# Patient Record
Sex: Male | Born: 1945 | Race: White | Hispanic: No | Marital: Married | Smoking: Never smoker
Health system: Southern US, Community
[De-identification: ages and names within clinical notes are randomized; demographics above are authoritative.]

## PROBLEM LIST (undated history)

## (undated) DIAGNOSIS — I10 Essential (primary) hypertension: Secondary | ICD-10-CM

## (undated) DIAGNOSIS — E785 Hyperlipidemia, unspecified: Secondary | ICD-10-CM

## (undated) HISTORY — PX: MITRAL VALVE REPAIR: SHX2039

## (undated) HISTORY — PX: HERNIA REPAIR: SHX51

---

## 2019-11-25 ENCOUNTER — Encounter (HOSPITAL_COMMUNITY): Payer: Self-pay

## 2019-11-25 ENCOUNTER — Other Ambulatory Visit: Payer: Self-pay

## 2019-11-25 ENCOUNTER — Ambulatory Visit (HOSPITAL_COMMUNITY)
Admission: EM | Admit: 2019-11-25 | Discharge: 2019-11-25 | Disposition: A | Discharge status: Home / Self Care | Payer: Medicare Other | Attending: Family Medicine | Admitting: Family Medicine

## 2019-11-25 DIAGNOSIS — J209 Acute bronchitis, unspecified: Secondary | ICD-10-CM

## 2019-11-25 DIAGNOSIS — U071 COVID-19: Secondary | ICD-10-CM

## 2019-11-25 HISTORY — DX: Essential (primary) hypertension: I10

## 2019-11-25 HISTORY — DX: Hyperlipidemia, unspecified: E78.5

## 2019-11-25 LAB — POC SARS CORONAVIRUS 2 AG -  ED: SARS Coronavirus 2 Ag: POSITIVE — AB

## 2019-11-25 LAB — POC SARS CORONAVIRUS 2 AG: SARS Coronavirus 2 Ag: POSITIVE — AB

## 2019-11-25 MED ORDER — DEXAMETHASONE SODIUM PHOSPHATE 10 MG/ML IJ SOLN
10.0000 mg | Freq: Once | INTRAMUSCULAR | Status: AC
  Administered 2019-11-25: 10 mg via INTRAMUSCULAR

## 2019-11-25 MED ORDER — ALBUTEROL SULFATE HFA 108 (90 BASE) MCG/ACT IN AERS: 1.0000 | INHALATION_SPRAY | RESPIRATORY_TRACT | 0 refills | Status: AC | PRN

## 2019-11-25 MED ORDER — BENZONATATE 200 MG PO CAPS: 200.0000 mg | ORAL_CAPSULE | Freq: Three times a day (TID) | ORAL | 0 refills | Status: AC | PRN

## 2019-11-25 MED ORDER — PREDNISONE 10 MG PO TABS: ORAL_TABLET | ORAL | 0 refills | Status: AC

## 2019-11-25 MED ORDER — DEXAMETHASONE SODIUM PHOSPHATE 10 MG/ML IJ SOLN
INTRAMUSCULAR | Status: AC
  Filled 2019-11-25: qty 1

## 2019-11-25 MED ORDER — AZITHROMYCIN 250 MG PO TABS: 250.0000 mg | ORAL_TABLET | Freq: Every day | ORAL | 0 refills | Status: AC

## 2019-11-25 NOTE — Discharge Instructions (Addendum)
COVID test positive, please quarantine for 10 days from when your symptoms started as well as until symptoms improving and fever free for 24 hours Please begin azithromycin-2 tablets today, 1 tablet for the following 4 days We gave you a shot of Decadron today, may continue with prednisone taper beginning in the morning with 6 tablets, decrease by 1 tablet each day until completion, take with food if you are able Albuterol inhaler as needed for shortness of breath and wheezing, chest tightness Tessalon every 8 hours for cough  Please follow-up if symptoms not resolving or worsening, developing fevers, increased shortness of breath or difficulty breathing, chest pain     Person Under Monitoring Name: Oscar Peters  Location: [place] [address]   Infection Prevention Recommendations for Individuals Confirmed to have, or Being Evaluated for, 2019 Novel Coronavirus (COVID-19) Infection Who Receive Care at Home  Individuals who are confirmed to have, or are being evaluated for, COVID-19 should follow the prevention steps below until a healthcare provider or local or state health department says they can return to normal activities.  Stay home except to get medical care You should restrict activities outside your home, except for getting medical care. Do not go to work, school, or public areas, and do not use public transportation or taxis.  Call ahead before visiting your doctor Before your medical appointment, call the healthcare provider and tell them that you have, or are being evaluated for, COVID-19 infection. This will help the healthcare providers office take steps to keep other people from getting infected. Ask your healthcare provider to call the local or state health department.  Monitor your symptoms Seek prompt medical attention if your illness is worsening (e.g., difficulty breathing). Before going to your medical appointment, call the healthcare  provider and tell them that you have, or are being evaluated for, COVID-19 infection. Ask your healthcare provider to call the local or state health department.  Wear a facemask You should wear a facemask that covers your nose and mouth when you are in the same room with other people and when you visit a healthcare provider. People who live with or visit you should also wear a facemask while they are in the same room with you.  Separate yourself from other people in your home As much as possible, you should stay in a different room from other people in your home. Also, you should use a separate bathroom, if available.  Avoid sharing household items You should not share dishes, drinking glasses, cups, eating utensils, towels, bedding, or other items with other people in your home. After using these items, you should wash them thoroughly with soap and water.  Cover your coughs and sneezes Cover your mouth and nose with a tissue when you cough or sneeze, or you can cough or sneeze into your sleeve. Throw used tissues in a lined trash can, and immediately wash your hands with soap and water for at least 20 seconds or use an alcohol-based hand rub.  Wash your [facility] your hands often and thoroughly with soap and water for at least 20 seconds. You can use an alcohol-based hand sanitizer if soap and water are not available and if your hands are not visibly dirty. Avoid touching your eyes, nose, and mouth with unwashed hands.   Prevention Steps for Caregivers and Household Members of Individuals Confirmed to have, or Being Evaluated for, COVID-19 Infection Being Cared for in the Home  If you live with,  or provide care at home for, a person confirmed to have, or being evaluated for, COVID-19 infection please follow these guidelines to prevent infection:  Follow healthcare providers instructions Make sure that you understand and can help the patient follow any healthcare provider  instructions for all care.  Provide for the patients basic needs You should help the patient with basic needs in the home and provide support for getting groceries, prescriptions, and other personal needs.  Monitor the patients symptoms If they are getting sicker, call his or her medical provider and tell them that the patient has, or is being evaluated for, COVID-19 infection. This will help the healthcare providers office take steps to keep other people from getting infected. Ask the healthcare provider to call the local or state health department.  Limit the number of people who have contact with the patient If possible, have only one caregiver for the patient. Other household members should stay in another home or place of residence. If this is not possible, they should stay in another room, or be separated from the patient as much as possible. Use a separate bathroom, if available. Restrict visitors who do not have an essential need to be in the home.  Keep older adults, very young children, and other sick people away from the patient Keep older adults, very young children, and those who have compromised immune systems or chronic health conditions away from the patient. This includes people with chronic heart, lung, or kidney conditions, diabetes, and cancer.  Ensure good ventilation Make sure that shared spaces in the home have good air flow, such as from an air conditioner or an opened window, weather permitting.  Wash your hands often Wash your hands often and thoroughly with soap and water for at least 20 seconds. You can use an alcohol based hand sanitizer if soap and water are not available and if your hands are not visibly dirty. Avoid touching your eyes, nose, and mouth with unwashed hands. Use disposable paper towels to dry your hands. If not available, use dedicated cloth towels and replace them when they become wet.  Wear a facemask and gloves Wear a disposable  facemask at all times in the room and gloves when you touch or have contact with the patients blood, body fluids, and/or secretions or excretions, such as sweat, saliva, sputum, nasal mucus, vomit, urine, or feces.  Ensure the mask fits over your nose and mouth tightly, and do not touch it during use. Throw out disposable facemasks and gloves after using them. Do not reuse. Wash your hands immediately after removing your facemask and gloves. If your personal clothing becomes contaminated, carefully remove clothing and launder. Wash your hands after handling contaminated clothing. Place all used disposable facemasks, gloves, and other waste in a lined container before disposing them with other household waste. Remove gloves and wash your hands immediately after handling these items.  Do not share dishes, glasses, or other household items with the patient Avoid sharing household items. You should not share dishes, drinking glasses, cups, eating utensils, towels, bedding, or other items with a patient who is confirmed to have, or being evaluated for, COVID-19 infection. After the person uses these items, you should wash them thoroughly with soap and water.  Wash laundry thoroughly Immediately remove and wash clothes or bedding that have blood, body fluids, and/or secretions or excretions, such as sweat, saliva, sputum, nasal mucus, vomit, urine, or feces, on them. Wear gloves when handling laundry from the patient. Read and  follow directions on labels of laundry or clothing items and detergent. In general, wash and dry with the warmest temperatures recommended on the label.  Clean all areas the individual has used often Clean all touchable surfaces, such as counters, tabletops, doorknobs, bathroom fixtures, toilets, phones, keyboards, tablets, and bedside tables, every day. Also, clean any surfaces that may have blood, body fluids, and/or secretions or excretions on them. Wear gloves when cleaning  surfaces the patient has come in contact with. Use a diluted bleach solution (e.g., dilute bleach with 1 part bleach and 10 parts water) or a household disinfectant with a label that says EPA-registered for coronaviruses. To make a bleach solution at home, add 1 tablespoon of bleach to 1 quart (4 cups) of water. For a larger supply, add  cup of bleach to 1 gallon (16 cups) of water. Read labels of cleaning products and follow recommendations provided on product labels. Labels contain instructions for safe and effective use of the cleaning product including precautions you should take when applying the product, such as wearing gloves or eye protection and making sure you have good ventilation during use of the product. Remove gloves and wash hands immediately after cleaning.  Monitor yourself for signs and symptoms of illness Caregivers and household members are considered close contacts, should monitor their health, and will be asked to limit movement outside of the home to the extent possible. Follow the monitoring steps for close contacts listed on the symptom monitoring form.   ? If you have additional questions, contact your local health department or call the epidemiologist on call at [phone] (available 24/7). ? This guidance is subject to change. For the most up-to-date guidance from [facility], please refer to their website: [email or web address]

## 2019-11-25 NOTE — ED Triage Notes (Signed)
Pt presents to UC w/ c/o dry cough x6 days. Pt has hx of bronchitis. Pt c/o wheezing at times. Pt denies fevers.

## 2019-11-25 NOTE — ED Provider Notes (Addendum)
[facility]    CSN: [id number] Arrival date & time: 11/25/19  1231      History   Chief Complaint Chief Complaint  Patient presents with   Cough    HPI Oscar Peters is a 73 y.o. male history of hypertension, hyperlipidemia, presenting today for evaluation of a cough.  Patient states that he has had a cough for approximately 1 week.  Symptoms began on Monday.  He has had a lot of wheezing and difficulty taking a deep breath without coughing.  He has felt chest tightness.  Feels very similar to when he had bronchitis last year.  Denies history of asthma.  He denies any known fevers.  Denies known exposure to Covid.  He has been using over-the-counter Robitussin as well as NyQuil.  HPI  Past Medical History:  Diagnosis Date   Hyperlipidemia    Hypertension     There are no active problems to display for this patient.   Past Surgical History:  Procedure Laterality Date   HERNIA REPAIR     MITRAL VALVE REPAIR         Home Medications    Prior to Admission medications   Medication Sig Start Date End Date Taking? Authorizing Provider  aspirin EC 81 MG tablet Take 81 mg by mouth daily.   Yes [provider]  atorvastatin (LIPITOR) 10 MG tablet Take 10 mg by mouth daily.   Yes [provider]  co-enzyme Q-10 50 MG capsule Take 50 mg by mouth daily.   Yes [provider]  losartan (COZAAR) 25 MG tablet Take 25 mg by mouth daily.   Yes [provider]  metoprolol tartrate (LOPRESSOR) 50 MG tablet Take 50 mg by mouth 2 (two) times daily.   Yes [provider]  albuterol (VENTOLIN HFA) 108 (90 Base) MCG/ACT inhaler Inhale 1-2 puffs into the lungs every 4 (four) hours as needed for wheezing or shortness of breath. 11/25/19   Dewarren Ledbetter C, PA-C  azithromycin (ZITHROMAX) 250 MG tablet Take 1 tablet (250 mg total) by mouth daily. Take first 2 tablets together, then 1 every day until finished. 11/25/19   Christyan Reger,  Wyeth Hoffer C, PA-C  benzonatate (TESSALON) 200 MG capsule Take 1 capsule (200 mg total) by mouth 3 (three) times daily as needed for up to 7 days for cough. 11/25/19 12/02/19  Ashden Sonnenberg C, PA-C  predniSONE (DELTASONE) 10 MG tablet Begin with 6 tab on day 1, 5 tabs on day 2, 4 tab on day 3, 3 tab on day 4, 2 tab on day 5, 1 tab on day 6, take with food and in the morning if you are able 11/25/19   Luvina Poirier, Junius Creamer, PA-C    Family History Family History  Problem Relation Age of Onset   Healthy Mother    Healthy Father     Social History Social History   Tobacco Use   Smoking status: Never Smoker   Smokeless tobacco: Never Used  Substance Use Topics   Alcohol use: Not on file   Drug use: Not on file     Allergies   Patient has no known allergies.   Review of Systems Review of Systems  Constitutional: Negative for activity change, appetite change, chills, fatigue and fever.  HENT: Negative for congestion, ear pain, rhinorrhea, sinus pressure, sore throat and trouble swallowing.   Eyes: Negative for discharge and redness.  Respiratory: Positive for cough, chest tightness, shortness of breath and wheezing.   Cardiovascular: Negative  for chest pain.  Gastrointestinal: Negative for abdominal pain, diarrhea, nausea and vomiting.  Musculoskeletal: Negative for myalgias.  Skin: Negative for rash.  Neurological: Negative for dizziness, light-headedness and headaches.     Physical Exam Triage Vital Signs ED Triage Vitals  Enc Vitals Group     BP 11/25/19 1320 (!) 143/87     Pulse Rate 11/25/19 1320 69     Resp 11/25/19 1320 16     Temp 11/25/19 1320 99.2 F (37.3 C)     Temp Source 11/25/19 1320 Oral     SpO2 11/25/19 1320 95 %     Weight --      Height --      Head Circumference --      Peak Flow --      Pain Score 11/25/19 1323 0     Pain Loc --      Pain Edu? --      Excl. in GC? --    No data found.  Updated Vital Signs BP (!) 143/87 (BP Location: Left  Arm)    Pulse 69    Temp 99.2 F (37.3 C) (Oral)    Resp 16    SpO2 95%   Visual Acuity Right Eye Distance:   Left Eye Distance:   Bilateral Distance:    Right Eye Near:   Left Eye Near:    Bilateral Near:     Physical Exam Vitals signs and nursing note reviewed.  Constitutional:      Appearance: He is well-developed.  HENT:     Head: Normocephalic and atraumatic.     Ears:     Comments: Bilateral ears without tenderness to palpation of external auricle, tragus and mastoid, EAC's without erythema or swelling, TM's with good bony landmarks and cone of light. Non erythematous.     Nose:     Comments: Nasal mucosa pink, nonswollen turbinates    Mouth/Throat:     Comments: Oral mucosa pink and moist, no tonsillar enlargement or exudate. Posterior pharynx patent and nonerythematous, no uvula deviation or swelling. Normal phonation.  Eyes:     Conjunctiva/sclera: Conjunctivae normal.  Neck:     Musculoskeletal: Neck supple.  Cardiovascular:     Rate and Rhythm: Normal rate and regular rhythm.     Heart sounds: No murmur.  Pulmonary:     Effort: Pulmonary effort is normal. No respiratory distress.     Breath sounds: Wheezing and rhonchi present.     Comments: Frequent coughing in room, inspiratory and expiratory wheezing and rhonchi noted throughout all lung fields, all lung sounds were coarse Abdominal:     Palpations: Abdomen is soft.     Tenderness: There is no abdominal tenderness.  Skin:    General: Skin is warm and dry.  Neurological:     Mental Status: He is alert.      UC Treatments / Results  Labs (all labs ordered are listed, but only abnormal results are displayed) Labs Reviewed  POC SARS CORONAVIRUS 2 AG -  ED - Abnormal; Notable for the following components:      Result Value   SARS Coronavirus 2 Ag POSITIVE (*)    All other components within normal limits  POC SARS CORONAVIRUS 2 AG - Abnormal; Notable for the following components:   SARS Coronavirus 2  Ag POSITIVE (*)    All other components within normal limits    EKG   Radiology No results found.  Procedures Procedures (including critical care time)  Medications  Ordered in UC Medications  dexamethasone (DECADRON) injection 10 mg (has no administration in time range)    Initial Impression / Assessment and Plan / UC Course  I have reviewed the triage vital signs and the nursing notes.  Pertinent labs & imaging results that were available during my care of the patient were reviewed by me and considered in my medical decision making (see chart for details).     Rapid Covid positive, lung exam also suggestive of bronchitis.  Initiating on prednisone and albuterol inhaler, given symptoms x1 week we will go ahead and cover atypicals with azithromycin.  Tessalon for cough.  Decadron prior to discharge given significant wheezing and inflammation in lungs.  Continue to rest and drink plenty of fluids.  Discussed quarantine requirements.  Continue to monitor breathing,Discussed strict return precautions. Patient verbalized understanding and is agreeable with plan.  Final Clinical Impressions(s) / UC Diagnoses   Final diagnoses:  Acute bronchitis, unspecified organism  COVID-19 virus infection     Discharge Instructions     COVID test positive, please quarantine for 10 days from when your symptoms started as well as until symptoms improving and fever free for 24 hours Please begin azithromycin-2 tablets today, 1 tablet for the following 4 days We gave you a shot of Decadron today, may continue with prednisone taper beginning in the morning with 6 tablets, decrease by 1 tablet each day until completion, take with food if you are able Albuterol inhaler as needed for shortness of breath and wheezing, chest tightness Tessalon every 8 hours for cough  Please follow-up if symptoms not resolving or worsening, developing fevers, increased shortness of breath or difficulty breathing, chest  pain     Person Under Monitoring Name: Oscar Peters  Location: [place] [address]   Infection Prevention Recommendations for Individuals Confirmed to have, or Being Evaluated for, 2019 Novel Coronavirus (COVID-19) Infection Who Receive Care at Home  Individuals who are confirmed to have, or are being evaluated for, COVID-19 should follow the prevention steps below until a healthcare provider or local or state health department says they can return to normal activities.  Stay home except to get medical care You should restrict activities outside your home, except for getting medical care. Do not go to work, school, or public areas, and do not use public transportation or taxis.  Call ahead before visiting your doctor Before your medical appointment, call the healthcare provider and tell them that you have, or are being evaluated for, COVID-19 infection. This will help the healthcare providers office take steps to keep other people from getting infected. Ask your healthcare provider to call the local or state health department.  Monitor your symptoms Seek prompt medical attention if your illness is worsening (e.g., difficulty breathing). Before going to your medical appointment, call the healthcare provider and tell them that you have, or are being evaluated for, COVID-19 infection. Ask your healthcare provider to call the local or state health department.  Wear a facemask You should wear a facemask that covers your nose and mouth when you are in the same room with other people and when you visit a healthcare provider. People who live with or visit you should also wear a facemask while they are in the same room with you.  Separate yourself from other people in your home As much as possible, you should stay in a different room from other people in your home. Also, you should use a separate bathroom, if  available.  Avoid sharing household items You should  not share dishes, drinking glasses, cups, eating utensils, towels, bedding, or other items with other people in your home. After using these items, you should wash them thoroughly with soap and water.  Cover your coughs and sneezes Cover your mouth and nose with a tissue when you cough or sneeze, or you can cough or sneeze into your sleeve. Throw used tissues in a lined trash can, and immediately wash your hands with soap and water for at least 20 seconds or use an alcohol-based hand rub.  Wash your [employer] your hands often and thoroughly with soap and water for at least 20 seconds. You can use an alcohol-based hand sanitizer if soap and water are not available and if your hands are not visibly dirty. Avoid touching your eyes, nose, and mouth with unwashed hands.   Prevention Steps for Caregivers and Household Members of Individuals Confirmed to have, or Being Evaluated for, COVID-19 Infection Being Cared for in the Home  If you live with, or provide care at home for, a person confirmed to have, or being evaluated for, COVID-19 infection please follow these guidelines to prevent infection:  Follow healthcare providers instructions Make sure that you understand and can help the patient follow any healthcare provider instructions for all care.  Provide for the patients basic needs You should help the patient with basic needs in the home and provide support for getting groceries, prescriptions, and other personal needs.  Monitor the patients symptoms If they are getting sicker, call his or her medical provider and tell them that the patient has, or is being evaluated for, COVID-19 infection. This will help the healthcare providers office take steps to keep other people from getting infected. Ask the healthcare provider to call the local or state health department.  Limit the number of people who have contact with the patient  If possible, have only one caregiver for the  patient.  Other household members should stay in another home or place of residence. If this is not possible, they should stay  in another room, or be separated from the patient as much as possible. Use a separate bathroom, if available.  Restrict visitors who do not have an essential need to be in the home.  Keep older adults, very young children, and other sick people away from the patient Keep older adults, very young children, and those who have compromised immune systems or chronic health conditions away from the patient. This includes people with chronic heart, lung, or kidney conditions, diabetes, and cancer.  Ensure good ventilation Make sure that shared spaces in the home have good air flow, such as from an air conditioner or an opened window, weather permitting.  Wash your hands often  Wash your hands often and thoroughly with soap and water for at least 20 seconds. You can use an alcohol based hand sanitizer if soap and water are not available and if your hands are not visibly dirty.  Avoid touching your eyes, nose, and mouth with unwashed hands.  Use disposable paper towels to dry your hands. If not available, use dedicated cloth towels and replace them when they become wet.  Wear a facemask and gloves  Wear a disposable facemask at all times in the room and gloves when you touch or have contact with the patients blood, body fluids, and/or secretions or excretions, such as sweat, saliva, sputum, nasal mucus, vomit, urine, or feces.  Ensure the mask fits over your  nose and mouth tightly, and do not touch it during use.  Throw out disposable facemasks and gloves after using them. Do not reuse.  Wash your hands immediately after removing your facemask and gloves.  If your personal clothing becomes contaminated, carefully remove clothing and launder. Wash your hands after handling contaminated clothing.  Place all used disposable facemasks, gloves, and other waste in a lined  container before disposing them with other household waste.  Remove gloves and wash your hands immediately after handling these items.  Do not share dishes, glasses, or other household items with the patient  Avoid sharing household items. You should not share dishes, drinking glasses, cups, eating utensils, towels, bedding, or other items with a patient who is confirmed to have, or being evaluated for, COVID-19 infection.  After the person uses these items, you should wash them thoroughly with soap and water.  Wash laundry thoroughly  Immediately remove and wash clothes or bedding that have blood, body fluids, and/or secretions or excretions, such as sweat, saliva, sputum, nasal mucus, vomit, urine, or feces, on them.  Wear gloves when handling laundry from the patient.  Read and follow directions on labels of laundry or clothing items and detergent. In general, wash and dry with the warmest temperatures recommended on the label.  Clean all areas the individual has used often  Clean all touchable surfaces, such as counters, tabletops, doorknobs, bathroom fixtures, toilets, phones, keyboards, tablets, and bedside tables, every day. Also, clean any surfaces that may have blood, body fluids, and/or secretions or excretions on them.  Wear gloves when cleaning surfaces the patient has come in contact with.  Use a diluted bleach solution (e.g., dilute bleach with 1 part bleach and 10 parts water) or a household disinfectant with a label that says EPA-registered for coronaviruses. To make a bleach solution at home, add 1 tablespoon of bleach to 1 quart (4 cups) of water. For a larger supply, add  cup of bleach to 1 gallon (16 cups) of water.  Read labels of cleaning products and follow recommendations provided on product labels. Labels contain instructions for safe and effective use of the cleaning product including precautions you should take when applying the product, such as wearing gloves or  eye protection and making sure you have good ventilation during use of the product.  Remove gloves and wash hands immediately after cleaning.  Monitor yourself for signs and symptoms of illness Caregivers and household members are considered close contacts, should monitor their health, and will be asked to limit movement outside of the home to the extent possible. Follow the monitoring steps for close contacts listed on the symptom monitoring form.   ? If you have additional questions, contact your local health department or call the epidemiologist on call at [phone] (available 24/7). ? This guidance is subject to change. For the most up-to-date guidance from [facility], please refer to their website: [email or web address].hu    ED Prescriptions    Medication Sig Dispense Auth. Provider   albuterol (VENTOLIN HFA) 108 (90 Base) MCG/ACT inhaler Inhale 1-2 puffs into the lungs every 4 (four) hours as needed for wheezing or shortness of breath. 18 g Jeff Frieden C, PA-C   benzonatate (TESSALON) 200 MG capsule Take 1 capsule (200 mg total) by mouth 3 (three) times daily as needed for up to 7 days for cough. 28 capsule Burnice Vassel C, PA-C   azithromycin (ZITHROMAX) 250 MG tablet Take 1 tablet (250 mg total) by mouth daily. Take first 2 tablets  together, then 1 every day until finished. 6 tablet Nazia Rhines C, PA-C   predniSONE (DELTASONE) 10 MG tablet Begin with 6 tab on day 1, 5 tabs on day 2, 4 tab on day 3, 3 tab on day 4, 2 tab on day 5, 1 tab on day 6, take with food and in the morning if you are able 21 tablet Emylee Decelle C, PA-C     PDMP not reviewed this encounter.   Lew DawesWieters, Jomarie Gellis C, PA-C 11/25/19 1403    Lew DawesWieters, Tinea Nobile C, PA-C 11/25/19 1408

## 2019-11-30 ENCOUNTER — Ambulatory Visit (HOSPITAL_COMMUNITY)
Admission: EM | Admit: 2019-11-30 | Discharge: 2019-11-30 | Disposition: A | Discharge status: Home / Self Care | Payer: Medicare Other | Attending: Family Medicine | Admitting: Family Medicine

## 2019-11-30 ENCOUNTER — Encounter (HOSPITAL_COMMUNITY): Payer: Self-pay

## 2019-11-30 ENCOUNTER — Telehealth: Payer: Medicare Other

## 2019-11-30 ENCOUNTER — Ambulatory Visit (INDEPENDENT_AMBULATORY_CARE_PROVIDER_SITE_OTHER): Payer: Medicare Other

## 2019-11-30 ENCOUNTER — Other Ambulatory Visit: Payer: Self-pay

## 2019-11-30 DIAGNOSIS — R066 Hiccough: Secondary | ICD-10-CM | POA: Diagnosis not present

## 2019-11-30 DIAGNOSIS — U071 COVID-19: Secondary | ICD-10-CM | POA: Diagnosis not present

## 2019-11-30 MED ORDER — FAMOTIDINE 20 MG PO TABS: 20.0000 mg | ORAL_TABLET | Freq: Two times a day (BID) | ORAL | 0 refills | Status: AC

## 2019-11-30 NOTE — ED Provider Notes (Signed)
[facility]    CSN: [id number] Arrival date & time: 11/30/19  1056      History   Chief Complaint Chief Complaint  Patient presents with  . COVID+, hiccups, fever    HPI Oscar Peters is a 73 y.o. male.   Manson Allan presents with complaints of hiccups. Stops for a few hours and then returns and will last for hours. Has had hiccups like this for a few days now. Was seen here 11/29 and tested positive for covid-19.  States his breathing feels better. Completing course of prednisone today. Already completed course of azithromycin, and has used tessalon as needed. No increased shortness of breath. Feels weak. Temps of 99's at home. Temp 22f 102 a few days ago. Feels like temperatures are improving overall. Still using prn tylenol and motrin, last last night. No chest pain. Denies specific increased work of breathing, this has improved. Decreased appetite. Family members also with covid-19. History of htn, hyperlipidemia, mitral valve repair.     ROS per HPI, negative if not otherwise mentioned.      Past Medical History:  Diagnosis Date  . Hyperlipidemia   . Hypertension     There are no active problems to display for this patient.   Past Surgical History:  Procedure Laterality Date  . HERNIA REPAIR    . MITRAL VALVE REPAIR         Home Medications    Prior to Admission medications   Medication Sig Start Date End Date Taking? Authorizing Provider  albuterol (VENTOLIN HFA) 108 (90 Base) MCG/ACT inhaler Inhale 1-2 puffs into the lungs every 4 (four) hours as needed for wheezing or shortness of breath. 11/25/19   Wieters, Hallie C, PA-C  aspirin EC 81 MG tablet Take 81 mg by mouth daily.    [provider]  atorvastatin (LIPITOR) 10 MG tablet Take 10 mg by mouth daily.    [provider]  azithromycin (ZITHROMAX) 250 MG tablet Take 1 tablet (250 mg total) by mouth daily. Take first 2 tablets together, then 1 every day until  finished. 11/25/19   Wieters, Hallie C, PA-C  benzonatate (TESSALON) 200 MG capsule Take 1 capsule (200 mg total) by mouth 3 (three) times daily as needed for up to 7 days for cough. 11/25/19 12/02/19  Wieters, Hallie C, PA-C  co-enzyme Q-10 50 MG capsule Take 50 mg by mouth daily.    [provider]  famotidine (PEPCID) 20 MG tablet Take 1 tablet (20 mg total) by mouth 2 (two) times daily. 11/30/19   Georgetta Haber, NP  losartan (COZAAR) 25 MG tablet Take 25 mg by mouth daily.    [provider]  metoprolol tartrate (LOPRESSOR) 50 MG tablet Take 50 mg by mouth 2 (two) times daily.    [provider]  predniSONE (DELTASONE) 10 MG tablet Begin with 6 tab on day 1, 5 tabs on day 2, 4 tab on day 3, 3 tab on day 4, 2 tab on day 5, 1 tab on day 6, take with food and in the morning if you are able 11/25/19   Wieters, Junius Creamer, PA-C    Family History Family History  Problem Relation Age of Onset  . Healthy Mother   . Healthy Father     Social History Social History   Tobacco Use  . Smoking status: Never Smoker  . Smokeless tobacco: Never Used  Substance Use Topics  . Alcohol use: Not on file  . Drug use:  Not on file     Allergies   Patient has no known allergies.   Review of Systems Review of Systems   Physical Exam Triage Vital Signs ED Triage Vitals  Enc Vitals Group     BP 11/30/19 1218 (!) 112/59     Pulse Rate 11/30/19 1218 74     Resp 11/30/19 1218 16     Temp 11/30/19 1218 99.4 F (37.4 C)     Temp Source 11/30/19 1218 Oral     SpO2 11/30/19 1218 94 %     Weight --      Height --      Head Circumference --      Peak Flow --      Pain Score 11/30/19 1222 0     Pain Loc --      Pain Edu? --      Excl. in GC? --    No data found.  Updated Vital Signs BP (!) 112/59 (BP Location: Right Arm)   Pulse 74   Temp 99.4 F (37.4 C) (Oral)   Resp 16   SpO2 94%    Physical Exam Constitutional:      Appearance: He is well-developed.   Cardiovascular:     Rate and Rhythm: Normal rate and regular rhythm.  Pulmonary:     Effort: Pulmonary effort is normal.     Breath sounds: Normal breath sounds.     Comments: Occasional congested cough noted; no hiccups noted  Skin:    General: Skin is warm and dry.  Neurological:     Mental Status: He is alert and oriented to person, place, and time.      UC Treatments / Results  Labs (all labs ordered are listed, but only abnormal results are displayed) Labs Reviewed - No data to display  EKG   Radiology Dg Chest 2 View  Result Date: 11/30/2019 CLINICAL DATA:  COVID-19. EXAM: CHEST - 2 VIEW COMPARISON:  None. FINDINGS: The heart size and mediastinal contours are within normal limits. No pneumothorax or pleural effusion is noted. Mild bibasilar opacities are noted which may represent atelectasis or infiltrates. The visualized skeletal structures are unremarkable. IMPRESSION: Mild bibasilar opacities are noted representing atelectasis or infiltrates. Electronically Signed   By: Lupita RaiderJames  Green Jr M.D.   On: 11/30/2019 12:41    Procedures Procedures (including critical care time)  Medications Ordered in UC Medications - No data to display  Initial Impression / Assessment and Plan / UC Course  I have reviewed the triage vital signs and the nursing notes.  Pertinent labs & imaging results that were available during my care of the patient were reviewed by me and considered in my medical decision making (see chart for details).     Patient appears stable at this time. Chest xray with mild findings. No hypoxia today. Heart rate and temp remain stable. Patient reports feeling overall improved. Will try pepcid for hiccups. Strict return/er precautions discussed. Patient verbalized understanding and agreeable to plan. Ambulatory out of clinic without difficulty.    Final Clinical Impressions(s) / UC Diagnoses   Final diagnoses:  COVID-19 virus infection  Hiccups     Discharge  Instructions     Your vital signs are reassuring here today.  You may try some vitamins to help your immune system potentially:  Vitamin C 500mg  twice a day. Zing 50mg  daily. Vitamin D 5000IU daily.   Push fluids to ensure adequate hydration and keep secretions thin.  Rest.  Continue with  tylenol and ibuprofen as needed for fevers or body aches.  Your inhaler as needed.  Pepcid twice a day may help with hiccups. If you have any worsening of shortness of breath , difficulty breathing, or otherwise worsening please present to the ER.     ED Prescriptions    Medication Sig Dispense Auth. Provider   famotidine (PEPCID) 20 MG tablet Take 1 tablet (20 mg total) by mouth 2 (two) times daily. 30 tablet Zigmund Gottron, NP     PDMP not reviewed this encounter.   Zigmund Gottron, NP 11/30/19 1308

## 2019-11-30 NOTE — ED Triage Notes (Signed)
Pt presents to UC stating he was here 5 days ago diagnosed w/ bronchitis and covid-19. Pt states he has been hiccupping on and off since then. Pt states he has had fever since 4 days ago.

## 2019-11-30 NOTE — Discharge Instructions (Signed)
Your vital signs are reassuring here today.  You may try some vitamins to help your immune system potentially:  Vitamin C 500mg  twice a day. Zing 50mg  daily. Vitamin D 5000IU daily.   Push fluids to ensure adequate hydration and keep secretions thin.  Rest.  Continue with tylenol and ibuprofen as needed for fevers or body aches.  Your inhaler as needed.  Pepcid twice a day may help with hiccups. If you have any worsening of shortness of breath , difficulty breathing, or otherwise worsening please present to the ER.

## 2020-11-26 IMAGING — DX DG CHEST 2V
2 series · 2 of 2 positions shown · non-contrast
Comparison: None.

CLINICAL DATA: [id number].

EXAM:
CHEST - 2 VIEW

[chest pa]
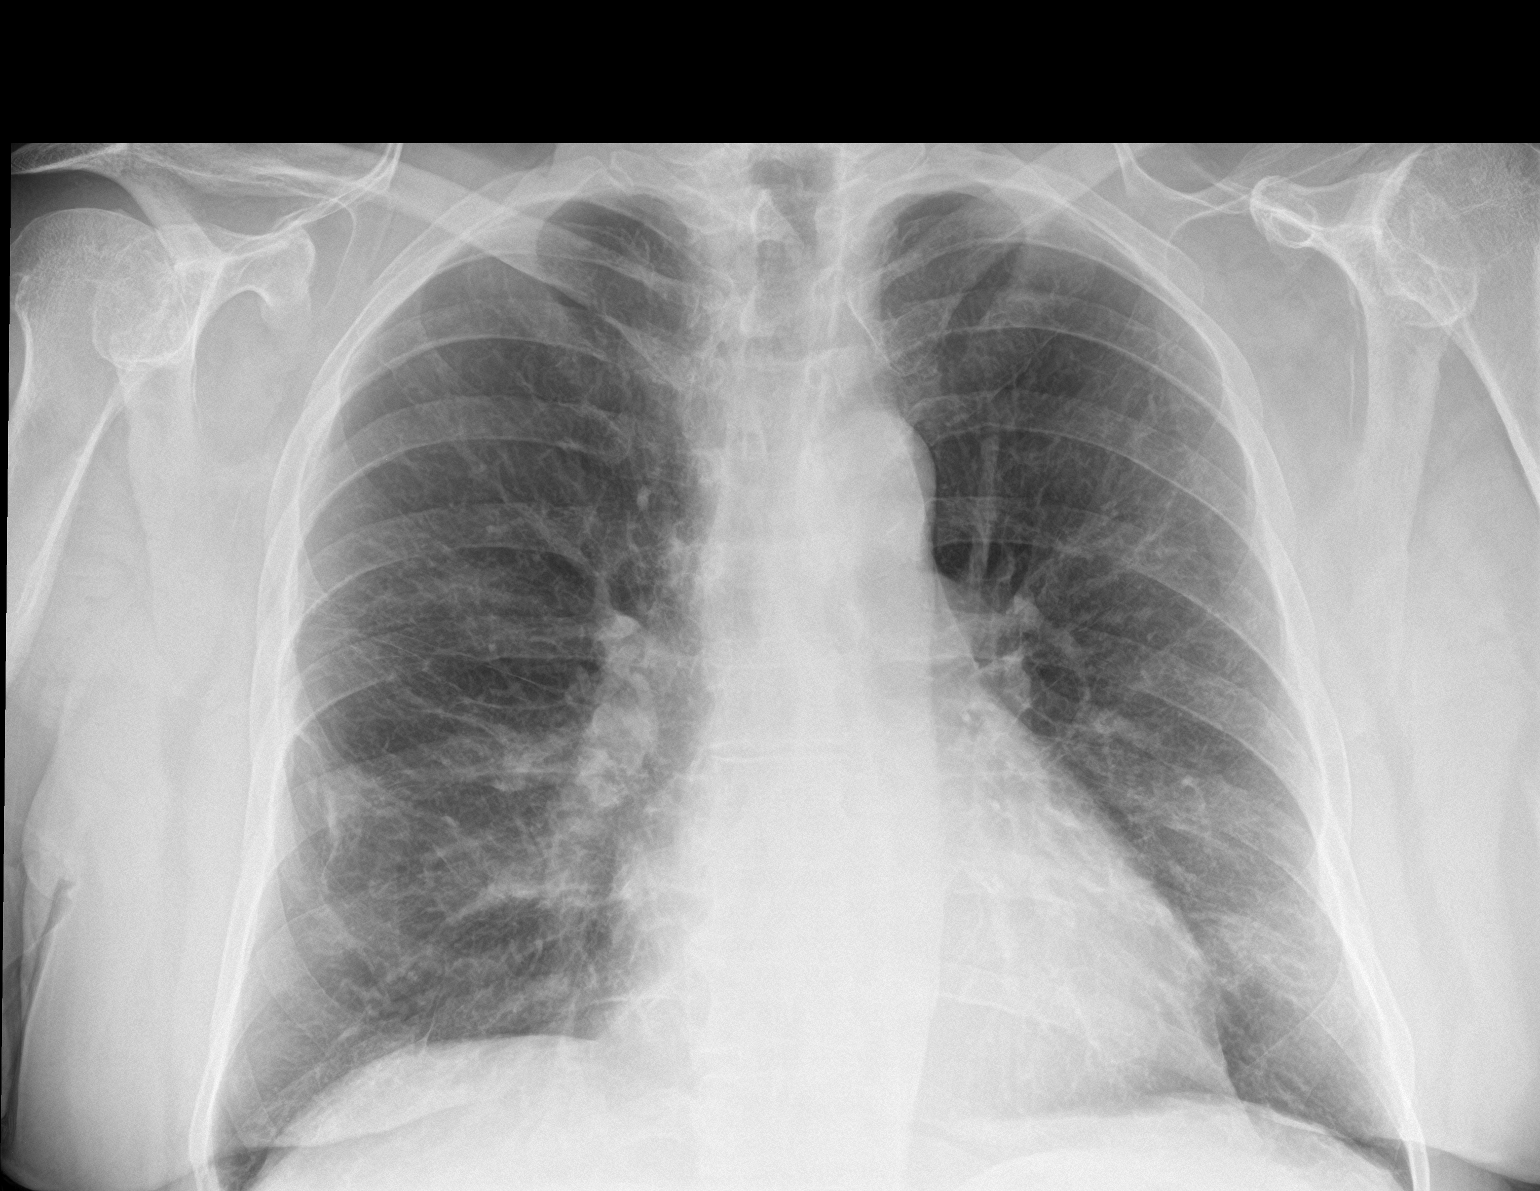

[chest lat]
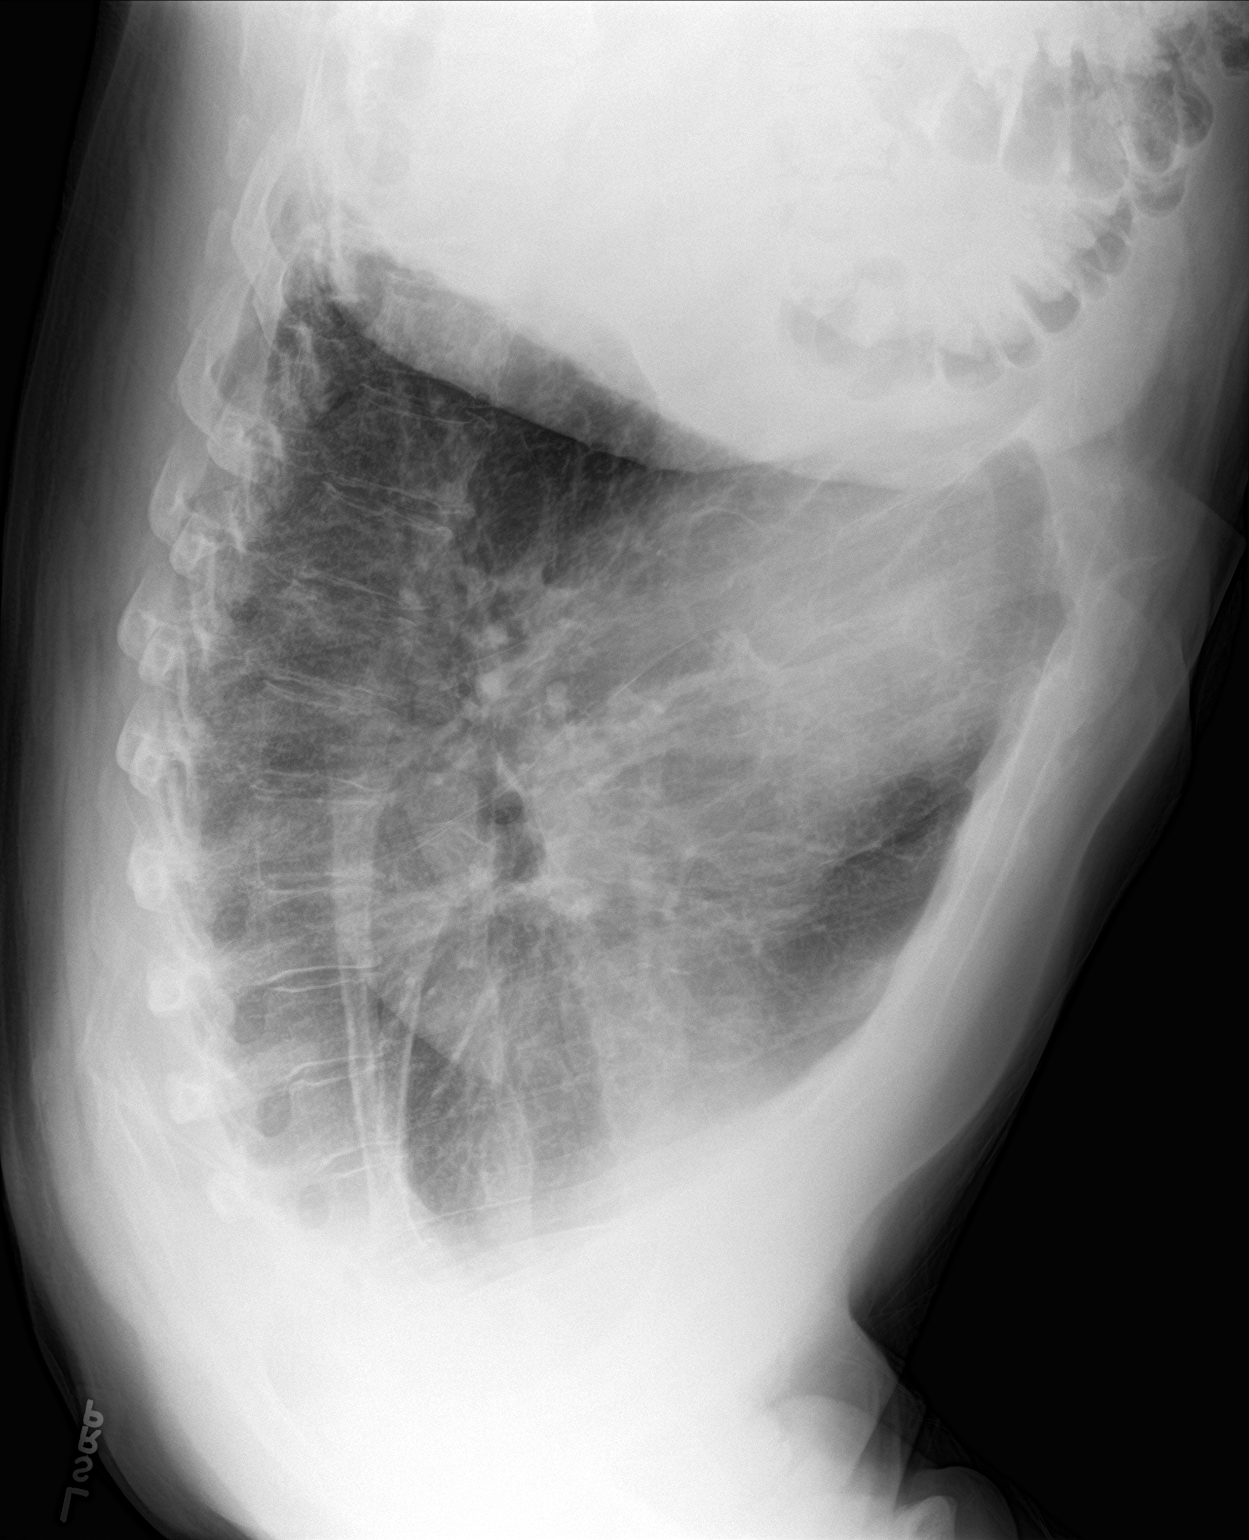

[2 of 2 positions shown; findings below may reference images not displayed]

FINDINGS: The heart size and mediastinal contours are within normal limits. No
pneumothorax or pleural effusion is noted. Mild bibasilar opacities
are noted which may represent atelectasis or infiltrates. The
visualized skeletal structures are unremarkable.
IMPRESSION: Mild bibasilar opacities are noted representing atelectasis or
infiltrates.

## 2021-01-27 DEATH — deceased
